# Patient Record
Sex: Male | Born: 1935 | Race: White | Hispanic: No | Marital: Married
Health system: Southern US, Community
[De-identification: ages and names within clinical notes are randomized; demographics above are authoritative.]

## PROBLEM LIST (undated history)

## (undated) DIAGNOSIS — F32A Depression, unspecified: Secondary | ICD-10-CM

## (undated) DIAGNOSIS — W19XXXA Unspecified fall, initial encounter: Secondary | ICD-10-CM

## (undated) DIAGNOSIS — E876 Hypokalemia: Secondary | ICD-10-CM

## (undated) DIAGNOSIS — I10 Essential (primary) hypertension: Secondary | ICD-10-CM

## (undated) DIAGNOSIS — H04129 Dry eye syndrome of unspecified lacrimal gland: Secondary | ICD-10-CM

## (undated) DIAGNOSIS — F329 Major depressive disorder, single episode, unspecified: Secondary | ICD-10-CM

## (undated) DIAGNOSIS — F0390 Unspecified dementia without behavioral disturbance: Secondary | ICD-10-CM

## (undated) DIAGNOSIS — M109 Gout, unspecified: Secondary | ICD-10-CM

---

## 2000-08-23 ENCOUNTER — Emergency Department (HOSPITAL_COMMUNITY): Admission: EM | Admit: 2000-08-23 | Discharge: 2000-08-23 | Payer: Self-pay | Admitting: Emergency Medicine

## 2006-11-19 ENCOUNTER — Encounter: Admission: RE | Admit: 2006-11-19 | Discharge: 2006-11-19 | Payer: Self-pay | Admitting: Internal Medicine

## 2007-06-16 ENCOUNTER — Ambulatory Visit: Admission: RE | Admit: 2007-06-16 | Discharge: 2007-07-01 | Payer: Self-pay | Admitting: Radiation Oncology

## 2007-07-02 ENCOUNTER — Ambulatory Visit: Admission: RE | Admit: 2007-07-02 | Discharge: 2007-09-30 | Payer: Self-pay | Admitting: Radiation Oncology

## 2007-10-01 ENCOUNTER — Ambulatory Visit: Admission: RE | Admit: 2007-10-01 | Discharge: 2007-10-15 | Payer: Self-pay | Admitting: Radiation Oncology

## 2008-12-08 ENCOUNTER — Inpatient Hospital Stay (HOSPITAL_COMMUNITY): Admission: EM | Admit: 2008-12-08 | Discharge: 2008-12-13 | Payer: Self-pay | Admitting: Emergency Medicine

## 2010-07-22 ENCOUNTER — Encounter: Payer: Self-pay | Admitting: Internal Medicine

## 2010-10-08 LAB — BASIC METABOLIC PANEL
BUN: 25 mg/dL — ABNORMAL HIGH (ref 6–23)
BUN: 30 mg/dL — ABNORMAL HIGH (ref 6–23)
CO2: 24 mEq/L (ref 19–32)
Calcium: 10.3 mg/dL (ref 8.4–10.5)
Calcium: 8.8 mg/dL (ref 8.4–10.5)
Calcium: 9.1 mg/dL (ref 8.4–10.5)
Chloride: 106 mEq/L (ref 96–112)
Creatinine, Ser: 0.98 mg/dL (ref 0.4–1.5)
Creatinine, Ser: 1.16 mg/dL (ref 0.4–1.5)
GFR calc Af Amer: >60 mL/min (ref >60)
GFR calc Af Amer: >60 mL/min (ref >60)
GFR calc non Af Amer: 59 mL/min — ABNORMAL LOW (ref >60)
GFR calc non Af Amer: >60 mL/min (ref >60)
GFR calc non Af Amer: >60 mL/min (ref >60)
Glucose, Bld: 98 mg/dL (ref 70–99)
Potassium: 3.7 mEq/L (ref 3.5–5.1)
Sodium: 137 mEq/L (ref 135–145)

## 2010-10-08 LAB — CBC
HCT: 34.3 % — ABNORMAL LOW (ref 39.0–52.0)
Hemoglobin: 11.6 g/dL — ABNORMAL LOW (ref 13.0–17.0)
MCHC: 34 g/dL (ref 30.0–36.0)
MCV: 91.8 fL (ref 78.0–100.0)
Platelets: 195 10*3/uL (ref 150–400)
Platelets: 214 10*3/uL (ref 150–400)
RBC: 3.76 MIL/uL — ABNORMAL LOW (ref 4.22–5.81)
RBC: 3.85 MIL/uL — ABNORMAL LOW (ref 4.22–5.81)
RDW: 12.8 % (ref 11.5–15.5)
WBC: 14.2 10*3/uL — ABNORMAL HIGH (ref 4.0–10.5)
WBC: 6.7 10*3/uL (ref 4.0–10.5)

## 2010-10-08 LAB — DIFFERENTIAL
Basophils Absolute: 0.1 10*3/uL (ref 0.0–0.1)
Eosinophils Absolute: 0.1 10*3/uL (ref 0.0–0.7)
Lymphocytes Relative: 11 % — ABNORMAL LOW (ref 12–46)
Lymphs Abs: 1.5 10*3/uL (ref 0.7–4.0)
Lymphs Abs: 1.6 10*3/uL (ref 0.7–4.0)
Monocytes Relative: 12 % (ref 3–12)
Neutro Abs: 4.2 10*3/uL (ref 1.7–7.7)
Neutrophils Relative %: 63 % (ref 43–77)
Neutrophils Relative %: 78 % — ABNORMAL HIGH (ref 43–77)

## 2010-10-08 LAB — HEPATIC FUNCTION PANEL
AST: 57 U/L — ABNORMAL HIGH (ref 0–37)
Bilirubin, Direct: 0.4 mg/dL — ABNORMAL HIGH (ref 0.0–0.3)
Indirect Bilirubin: 0.8 mg/dL (ref 0.3–0.9)
Total Bilirubin: 1.2 mg/dL (ref 0.3–1.2)

## 2010-10-08 LAB — CULTURE, BLOOD (ROUTINE X 2): Culture: NO GROWTH

## 2010-10-08 LAB — URINALYSIS, ROUTINE W REFLEX MICROSCOPIC
Glucose, UA: NEGATIVE mg/dL
Ketones, ur: 15 mg/dL — AB
Nitrite: NEGATIVE
Protein, ur: NEGATIVE mg/dL
pH: 5 (ref 5.0–8.0)

## 2010-10-08 LAB — CK TOTAL AND CKMB (NOT AT ARMC)
CK, MB: 7.8 ng/mL — ABNORMAL HIGH (ref 0.3–4.0)
Relative Index: 1 (ref 0.0–2.5)
Total CK: 772 U/L — ABNORMAL HIGH (ref 7–232)

## 2010-10-08 LAB — URINE CULTURE

## 2010-10-08 LAB — CARDIAC PANEL(CRET KIN+CKTOT+MB+TROPI)
CK, MB: 8.8 ng/mL — ABNORMAL HIGH (ref 0.3–4.0)
Relative Index: 1.4 (ref 0.0–2.5)
Total CK: 623 U/L — ABNORMAL HIGH (ref 7–232)
Troponin I: 0.01 ng/mL (ref 0.00–0.06)

## 2010-10-08 LAB — MAGNESIUM: Magnesium: 2.1 mg/dL (ref 1.5–2.5)

## 2010-10-08 LAB — PROTIME-INR
INR: 1.1 (ref 0.00–1.49)
Prothrombin Time: 15 seconds (ref 11.6–15.2)

## 2010-11-13 NOTE — H&P (Signed)
NAME:  [name], [name] NO.:  [id number]   MEDICAL RECORD NO.:  [id number]          PATIENT TYPE:  INP   LOCATION:  1859                         FACILITY:  [facility]   PHYSICIAN:  Ramiro Harvest, MD    DATE OF BIRTH:  [birth year]   DATE OF ADMISSION:  12/07/2008  DATE OF DISCHARGE:                              HISTORY & PHYSICAL   PRIMARY CARE PHYSICIAN:  Dr. Kirby Funk [facility].   HISTORY OF PRESENT ILLNESS:  Mr. Zachary Sellers is an 77 year old white  gentleman who lives alone with a history of dementia, hypertension, and  falls who presented to the ED with left foot pain.  The patient is a  poor historian and as such, history was obtained from the ED notes, as  well as from his daughter.  Per family the patient had been doing fine 4  days prior to admission.  The family usually calls the patient on an  every other day basis.  The daughter called 1 day prior to admission and  had no response.  One daughter then presented to the patient's home  where she found the front door open and found the morning newspapers  still laying in the driveway.  She found the patient lying on the floor  looking unkempt with left foot pain, erythema, warmth, and edema.  Per  family, the patient has also had decreased p.o. intake.  The patient  then presented to the ED where per ED records, the patient has had a II  to 3-day history of increasing left foot pain, erythema, and edema.  No  recent trauma.  The patient and family deny any fevers.  No chest pain,  no shortness of breath, no syncope, no nausea, no vomiting, no diarrhea,  no abdominal pain.  No focal neurological symptoms.  No cough, no  dysuria, no other associated symptoms.  The patient does endorse some  mild constipation intermittently.  The patient not too sure why he is in  the ED.  In the ED, x-rays of the left foot were negative for fracture.  CBC obtained had a white count of 14.2 and an ANC of 11.1, otherwise  was  within normal limits.  BMET with a potassium of 3.3, otherwise was  within normal limits.  We were called to admit the patient.  Per family,  the patient lives alone and also desires placement possibly in an  assisted living facility.   ALLERGIES:  NO KNOWN DRUG ALLERGIES.   PAST MEDICAL HISTORY:  1. Senile dementia.  2. Hypertension.  3. History of falls.   HOME MEDICATIONS:  1. Aricept 10 mg p.o. q.h.s.  2. HCTZ 12.5 mg p.o. daily   SOCIAL HISTORY:  The patient used to smoke tobacco approximately 30  years ago and has quit since then.  No tobacco use currently.  No  alcohol use.  No IV drug use.  The patient lives alone and uses a cane  occasionally.  The patient is widowed.   FAMILY HISTORY:  Noncontributory.   REVIEW OF SYSTEMS:  As per HPI, otherwise negative.  PHYSICAL EXAMINATION:  Temperature 99.4, blood pressure 131/82, pulse  104, respiratory rate 20, satting 97% on room air.  GENERAL:  Patient lying on gurney in no apparent distress.  HEENT: Normocephalic, atraumatic.  Pupils equal, round, and reactive to  light and accommodation.  Extraocular movements intact.  Oropharynx is  clear.  No lesions, no exudates.  Neck is supple.  No lymphadenopathy.  Dry mucous membranes.  RESPIRATORY:  Lungs are clear to auscultation bilaterally.  No wheezes,  no crackles, no rhonchi.  CARDIOVASCULAR:  Regular rate and rhythm.  No murmurs, rubs or gallops.  ABDOMEN:  Soft, nontender and nondistended.  Positive bowel sounds.  EXTREMITIES:  No clubbing.  No cyanosis.  Left foot with erythema on the  medial ankle and the forefoot with some warmth and edema as well.  NEUROLOGICALLY:  The patient is alert and oriented.  Cranial nerves II-  XII are grossly intact.  No focal deficits.   ADMISSION LABORATORY DATA:  Sodium 139, potassium 3.3, chloride 100,  bicarb 26, BUN 30, creatinine 1.16, glucose of 98, calcium of 10.3.  CBC  with a white count of 14.2, hemoglobin 14.5,  hematocrit 41.9, platelets  of 214, ANC of 11.1.  Plain films of the left ankle show no acute bony  findings.  Plain films of the left foot show no acute bony findings.   ASSESSMENT AND PLAN:  Mr. Zachary Sellers is an 77 year old gentleman who  presents to the emergency department with left foot pain, being found on  the floor per family.  1. Left foot cellulitis.  We will admit the patient to the floor.      Check blood cultures x2.  Check a UA with cultures and      sensitivities.  Check hepatic function.  Will elevate left foot      with warm compresses.  Will place on empiric IV clindamycin for      now.  PT, OT and follow.  2. Dehydration.  Hydrate with IV fluids.  Hold blood pressure      medications.  3. Questionable fall, likely mechanical in nature.  Will cycle cardiac      enzymes q.12 h. x2.  Check an EKG.  Hydrate with IV fluids.  PT,      OT.  4. Hypokalemia.  Check a magnesium level and replete.  5. Dementia.  Aricept.  6. Hypotension.  Hold blood pressure meds.  7. Prophylaxis.  Pepcid for GI prophylaxis.  Lovenox for DVT      prophylaxis.   It has been a pleasure taking care of Mr. Zachary Sellers.      Ramiro Harvest, MD  Electronically Signed     DT/MEDQ  D:  12/08/2008  T:  12/08/2008  Job:  [id number]   cc:   Thora Lance, M.D.

## 2010-11-13 NOTE — Discharge Summary (Signed)
NAME:  Zachary Sellers, Zachary Sellers NO.:  [id number]   MEDICAL RECORD NO.:  [id number]          PATIENT TYPE:  INP   LOCATION:  5004                         FACILITY:  [facility]   PHYSICIAN:  Thora Lance, M.D.  DATE OF BIRTH:  04/21/1917   DATE OF ADMISSION:  12/07/2008  DATE OF DISCHARGE:  12/13/2008                               DISCHARGE SUMMARY   REASON FOR ADMISSION:  [name] is a 77 year old white male who  presented to the emergency room with left foot pain.  One day prior to  admission there was no response and the daughter went to the patient's  home, where the patient was found lying on the floor unkempt and unable  to get up.  His left foot showed pain, erythema and warmth.  The patient  was sent to the emergency room.   SIGNIFICANT FINDINGS:  Blood pressure 131/82, heart rate 104,  temperature 99.4, respirations 20, saturations 97% on room air.  LUNGS:  Clear.  HEART:  Regular rate and rhythm.  ABDOMEN:  Benign.  EXTREMITIES:  Shows left foot with erythema and tenderness in the medial  proximal foot.   LABORATORIES:  Potassium 3.3, sodium 39, bicarbonate 26, chloride 100,  BUN 30, creatinine 1.16, glucose 98, calcium 10.3.  CBC:  WBC 14.2,  hemoglobin 14.5, platelet count 214.  Left ankle x-ray showed no acute  findings; left foot no acute findings.   HOSPITAL COURSE:  The patient was admitted for a left foot cellulitis.  He was started on clindamycin.  When I saw the patient's foot I felt he  probably had gout.  Prednisone was added to his regimen.  The patient  was treated with IV fluids for dehydration, and his BUN and creatinine  improved significantly to a level of 19.8.  His potassium was repleted.  His white count normalized to 6.6.  His foot pain and erythema totally  resolved.  The patient does have significant dementia, and at the  request of family he was sent for nursing home placement.   DISCHARGE DIAGNOSES:  1. Left foot gout.  2. Possible  left foot cellulitis.  3. Dementia.  4. Hypertension.   PROCEDURES:  None.   DISCHARGE MEDICATIONS:  1. Clindamycin 300 mg p.o. b.i.d. for two days and then discontinue.  2. Prednisone 10 mg daily for two days and then discontinue.  3. Aricept 10 mg q.h.s.  4. Hydrochlorothiazide 12.5 mg daily.  5. Potassium chloride 10 mEq daily.   DISPOSITION:  Discharge to nursing home.   CODE STATUS:  Full code.   DIET:  Low-sodium diet.   ACTIVITY:  As tolerated.           ______________________________  Thora Lance, M.D.     JJG/MEDQ  D:  12/13/2008  T:  12/13/2008  Job:  [id number]

## 2011-02-13 IMAGING — CR DG ANKLE COMPLETE 3+V*L*
3 series · 3 of 3 positions shown · non-contrast
Comparison: None

CLINICAL DATA: Ankle pain and swelling.

LEFT ANKLE COMPLETE - 3+ VIEW

[view not recorded (1 of 3)]
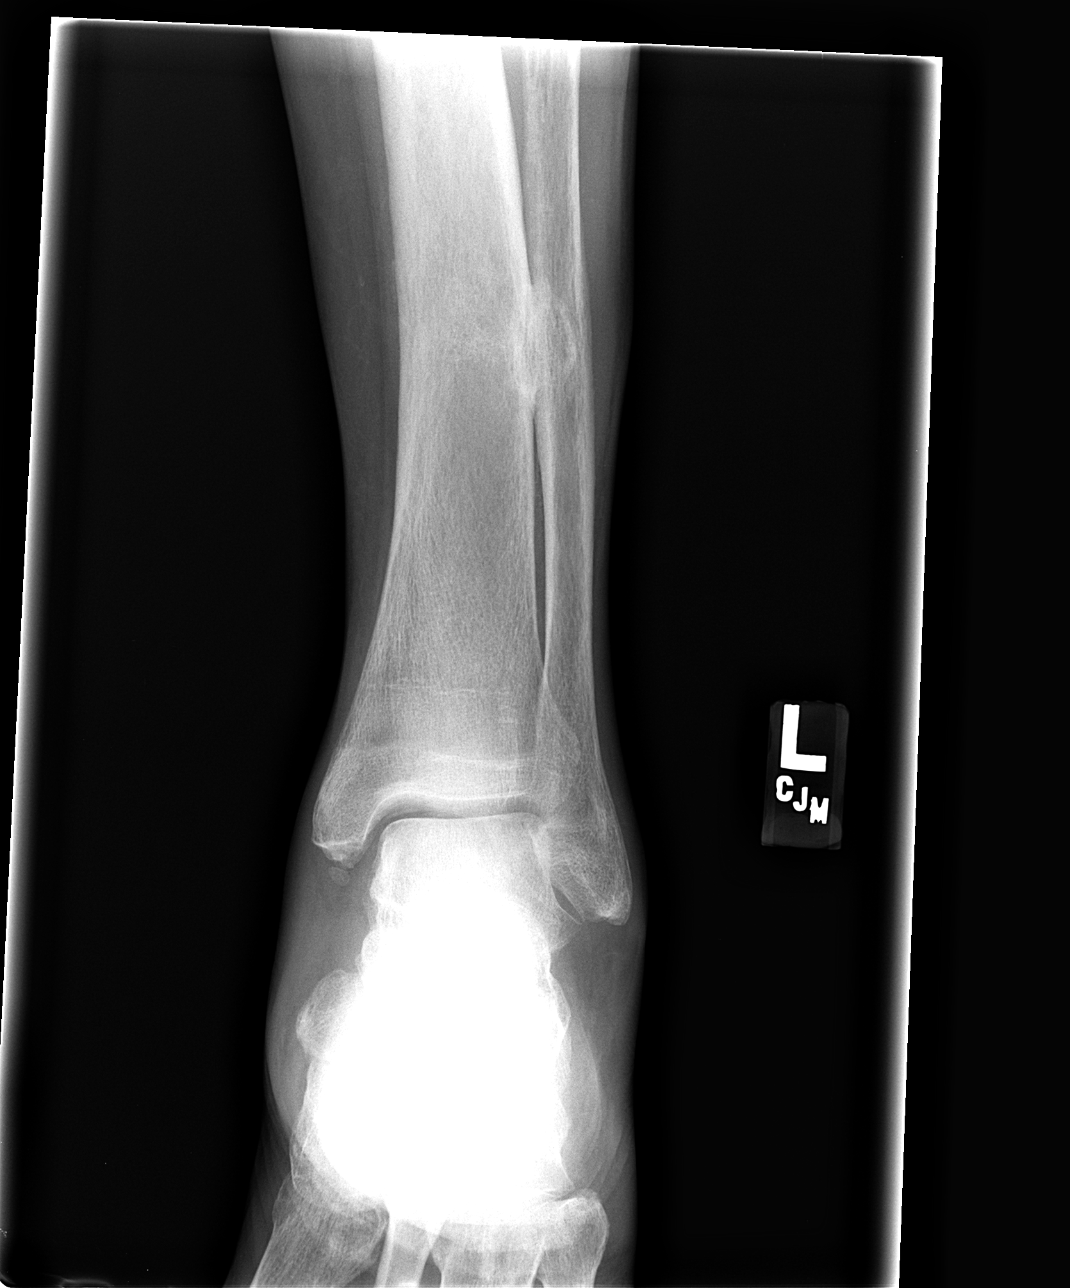

[view not recorded (2 of 3)]
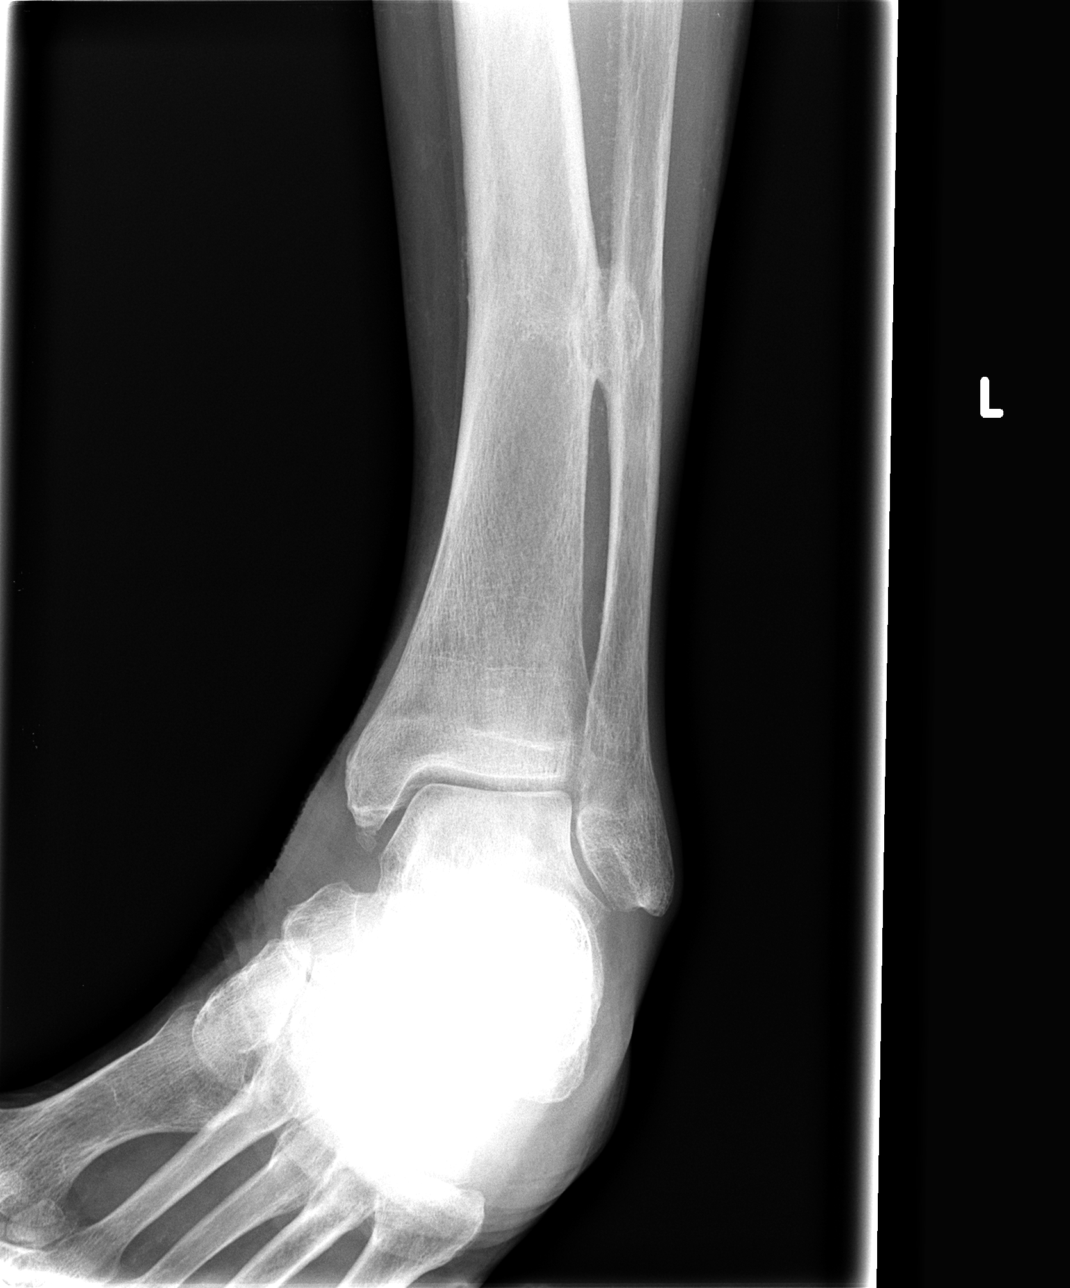

[view not recorded (3 of 3)]
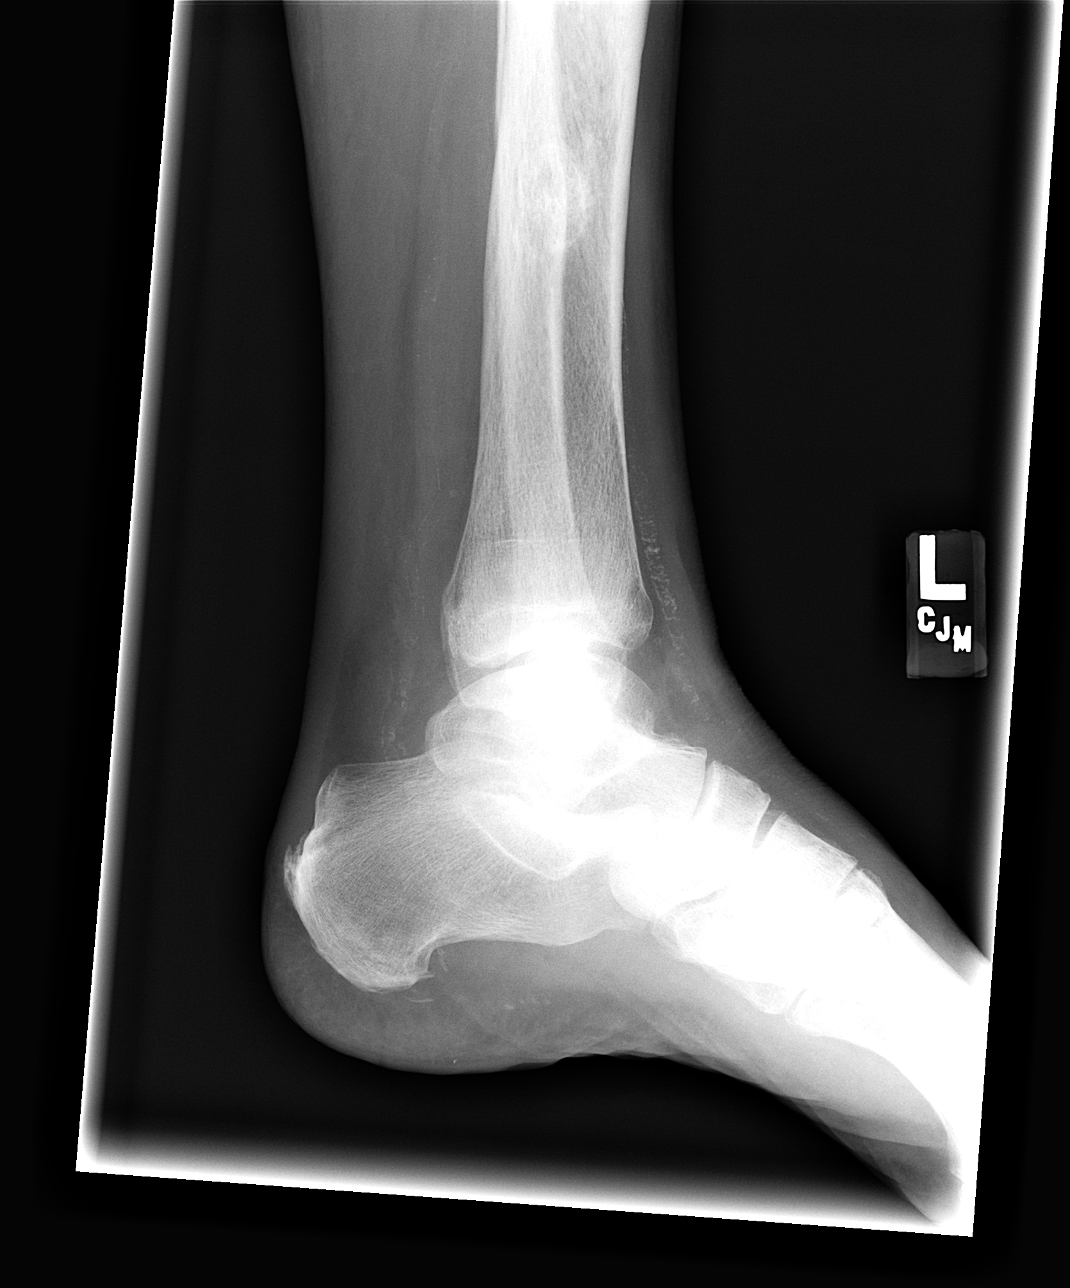

[3 of 3 positions shown; findings below may reference images not displayed]

FINDINGS: The ankle mortise is maintained.  No acute fractures are
seen.  No osteochondral abnormality.  Rounded density off the
distal tip of the medial malleolus is likely a remote avulsion
fracture or unfused secondary ossification center.  There is a
benign appearing osteochondroma projecting off the lateral aspect
of the tibia.

Extensive vascular calcifications.
IMPRESSION: No acute bony findings.

## 2014-09-18 ENCOUNTER — Emergency Department (HOSPITAL_COMMUNITY)
Admission: EM | Admit: 2014-09-18 | Discharge: 2014-09-18 | Disposition: A | Discharge status: Home / Self Care | Payer: Medicare Other | Attending: Emergency Medicine | Admitting: Emergency Medicine

## 2014-09-18 ENCOUNTER — Encounter (HOSPITAL_COMMUNITY): Payer: Self-pay | Admitting: Emergency Medicine

## 2014-09-18 ENCOUNTER — Emergency Department (HOSPITAL_COMMUNITY): Payer: Medicare Other

## 2014-09-18 DIAGNOSIS — Z8739 Personal history of other diseases of the musculoskeletal system and connective tissue: Secondary | ICD-10-CM | POA: Diagnosis not present

## 2014-09-18 DIAGNOSIS — R531 Weakness: Secondary | ICD-10-CM | POA: Diagnosis not present

## 2014-09-18 DIAGNOSIS — F0390 Unspecified dementia without behavioral disturbance: Secondary | ICD-10-CM | POA: Diagnosis not present

## 2014-09-18 DIAGNOSIS — I10 Essential (primary) hypertension: Secondary | ICD-10-CM | POA: Diagnosis not present

## 2014-09-18 DIAGNOSIS — Z8639 Personal history of other endocrine, nutritional and metabolic disease: Secondary | ICD-10-CM | POA: Insufficient documentation

## 2014-09-18 DIAGNOSIS — Z8669 Personal history of other diseases of the nervous system and sense organs: Secondary | ICD-10-CM | POA: Diagnosis not present

## 2014-09-18 HISTORY — DX: Essential (primary) hypertension: I10

## 2014-09-18 HISTORY — DX: Unspecified dementia, unspecified severity, without behavioral disturbance, psychotic disturbance, mood disturbance, and anxiety: F03.90

## 2014-09-18 HISTORY — DX: Hypokalemia: E87.6

## 2014-09-18 HISTORY — DX: Gout, unspecified: M10.9

## 2014-09-18 HISTORY — DX: Major depressive disorder, single episode, unspecified: F32.9

## 2014-09-18 HISTORY — DX: Unspecified fall, initial encounter: W19.XXXA

## 2014-09-18 HISTORY — DX: Dry eye syndrome of unspecified lacrimal gland: H04.129

## 2014-09-18 HISTORY — DX: Depression, unspecified: F32.A

## 2014-09-18 LAB — URINALYSIS, ROUTINE W REFLEX MICROSCOPIC
BILIRUBIN URINE: NEGATIVE
Glucose, UA: NEGATIVE mg/dL
HGB URINE DIPSTICK: NEGATIVE
Ketones, ur: NEGATIVE mg/dL
Leukocytes, UA: NEGATIVE
Nitrite: NEGATIVE
Protein, ur: NEGATIVE mg/dL
Specific Gravity, Urine: 1.023 (ref 1.005–1.030)
Urobilinogen, UA: 0.2 mg/dL (ref 0.0–1.0)
pH: 5.5 (ref 5.0–8.0)

## 2014-09-18 LAB — I-STAT TROPONIN, ED: Troponin i, poc: 0.01 ng/mL (ref 0.00–0.08)

## 2014-09-18 MED ORDER — SODIUM CHLORIDE 0.9 % IV BOLUS (SEPSIS)
500.0000 mL | Freq: Once | INTRAVENOUS | Status: AC
  Administered 2014-09-18: 500 mL via INTRAVENOUS

## 2014-09-18 NOTE — ED Notes (Signed)
Patient transported to CT 

## 2014-09-18 NOTE — ED Provider Notes (Signed)
  Face-to-face evaluation   History: Weakness noted, right face, by staff at his facility. He cannot contribute to history.  Physical exam: Alert, elderly man who is comfortable and calm. He is cooperative. He follows commands. No facial asymmetry. Normal grip strength, hands bilaterally.  Medical screening examination/treatment/procedure(s) were conducted as a shared visit with non-physician practitioner(s) and myself.  I personally evaluated the patient during the encounter   Mancel BaleElliott Eulan Heyward, MD 09/19/14 1021

## 2014-09-18 NOTE — ED Provider Notes (Signed)
CSN: [id number]     Arrival date & time 09/18/14  1234 History   First MD Initiated Contact with Patient 09/18/14 1251     Chief Complaint  Patient presents with  . Transient Ischemic Attack   Level V caveat: Dementia  (Consider location/radiation/quality/duration/timing/severity/associated sxs/prior Treatment) HPI Zachary Sellers is a 81 y.o. male with a history of dementia, hypertension comes in for evaluation of strokelike symptoms. History of present illness is given by [facility] as well as charge nurse, Crystal from [facility] where patient resides. Crystal reports patient is typically very playful, is independently ambulatory and usually sits up straight, but since this morning at approximately 11:00 the cook at the cafeteria noticed patient had a right sided facial droop and was leaning to his right side. He reports these symptoms all resolved at 11:10. Crystal also reports Friday night patient had unwitnessed fall and since then appears to be in more pain. Reports his baseline is slowed and slurred speech and is usually only oriented to person and sometimes struggles with this. No other aggravating or modifying factors. Patient appears playful in the ED, is smiling. Denies any discomfort or pain.  Past Medical History  Diagnosis Date  . Dementia   . Gout   . Hypertension   . Fall   . Depression   . Dry eye syndrome   . Hypokalemia    History reviewed. No pertinent past surgical history. No family history on file. History  Substance Use Topics  . Smoking status: Unknown If Ever Smoked  . Smokeless tobacco: Not on file  . Alcohol Use: No    Review of Systems  Unable to perform ROS       Allergies  Review of patient's allergies indicates not on file.  Home Medications   Prior to Admission medications   Not on File   BP 115/60 mmHg  Pulse 71  Temp(Src) 97.7 F (36.5 C) (Axillary)  Resp 21  SpO2 95% Physical Exam  Constitutional: He  appears well-developed and well-nourished.  Patient with dementia. GCS 15. Patient appears playful here in the ED.  HENT:  Head: Normocephalic and atraumatic.  Dry mucous membranes. Uvula midline. Edentulous  Eyes: Conjunctivae and EOM are normal. Pupils are equal, round, and reactive to light. Right eye exhibits no discharge. Left eye exhibits no discharge. No scleral icterus.  Neck: Normal range of motion. Neck supple.  Cardiovascular: Normal rate, regular rhythm and normal heart sounds.   Pulmonary/Chest: Effort normal and breath sounds normal. No respiratory distress. He has no wheezes. He has no rales.  Abdominal: Soft. There is no tenderness.  Musculoskeletal: He exhibits no tenderness.  No complaints of pain or grimacing with range of motion of extremities.  Neurological: He is alert.  Oriented to person. Cranial Nerves II-XII grossly intact. There appears to be mild right-sided facial droop with flattening of nasolabial fold. However patient is able to smile and blowout both cheeks symmetrically. Moves all extremities without ataxia. Grip strength intact and equal bilaterally.  Skin: Skin is warm and dry. No rash noted.  Psychiatric: He has a normal mood and affect.  Nursing note and vitals reviewed.   ED Course  Procedures (including critical care time) Labs Review Labs Reviewed  URINALYSIS, ROUTINE W REFLEX MICROSCOPIC  I-STAT TROPOININ, ED    Imaging Review Dg Chest 2 View  09/18/2014   CLINICAL DATA:  Per ED note: Pt arrived by [facility] from [facility] with c/o TIA. Pt has dementia  and alert to person only which is his norm. Staff placed pt at the table to eat and went to get his food when staff came back to pt, pt had right side facial droop, unable to move right side of body. LSN 1100 and Stroke symptoms discovered at 1110. EMS arrived and pt back to baseline. Equal grip strengths per EMS  EXAM: CHEST  2 VIEW  COMPARISON:  None.  FINDINGS: Cardiac silhouette  mildly enlarged. No mediastinal or hilar masses or evidence of adenopathy.  There is some posterior lung base opacity on the lateral view, likely atelectasis. Remainder of the lungs is clear. No pleural effusion or pneumothorax.  Bony thorax is demineralized but grossly intact.  IMPRESSION: No acute cardiopulmonary disease.   Electronically Signed   By: Amie Portland M.D.   On: 09/18/2014 15:06   Ct Head Wo Contrast  09/18/2014   CLINICAL DATA:  Pt arrived by [facility] from [facility] with c/o TIA. Pt has dementia and alert to person only which is his norm. Staff placed pt at the table to eat and went to get his food when staff came back to pt, pt had right side facial droop, unable to move right side of body. LSN 1100 and Stroke symptoms discovered at 1110. EMS arrived and pt back to baseline. Equal grip strengths per EMS.  EXAM: CT HEAD WITHOUT CONTRAST  TECHNIQUE: Contiguous axial images were obtained from the base of the skull through the vertex without intravenous contrast.  COMPARISON:  11/19/2006  FINDINGS: Ventricles normal configuration. There is ventricular sulcal enlargement reflecting moderate atrophy.  No parenchymal masses or mass effect. No evidence of a cortical infarct. Small lacune infarct noted along the superior left cerebellum. Mild patchy white matter hypoattenuation noted consistent with chronic microvascular ischemic change. These findings are stable.  No extra-axial masses or abnormal fluid collections.  There is no intracranial hemorrhage.  Small right frontal sinus is opacified with mucosal thickening. There is minor ethmoid sinus mucosal thickening. Remaining sinuses are clear. Clear mastoid air cells.  IMPRESSION: 1. No acute intracranial abnormalities. No CT evidence of an acute infarct. No intracranial hemorrhage. 2. Moderate atrophy and mild chronic microvascular ischemic change.   Electronically Signed   By: Amie Portland M.D.   On: 09/18/2014 13:29     EKG  Interpretation   Date/Time:  Sunday September 18 2014 12:55:56 EDT Ventricular Rate:  82 PR Interval:  184 QRS Duration: 88 QT Interval:  377 QTC Calculation: 440 R Axis:   8 Text Interpretation:  Sinus rhythm Low voltage, precordial leads since  last tracing no significant change Confirmed by Effie Shy  MD, ELLIOTT [phone])  on 09/18/2014 3:40:31 PM     Meds given in ED:  Medications  sodium chloride 0.9 % bolus 500 mL (0 mLs Intravenous Stopped 09/18/14 1650)    New Prescriptions   No medications on file   Filed Vitals:   09/18/14 1245 09/18/14 1305 09/18/14 1452  BP: 96/47 87/44 115/60  Pulse: 79 77 71  Temp: 97.7 F (36.5 C)    TempSrc: Axillary    Resp: SpO2: 95% 97% 95%     MDM  Velta Addison, patient's daughter is at bedside who reports patient has fallen 3 times in the last week and complains of his right shoulder hurting and that is why he leans to the right. She denies any facial droop and reports this is his baseline and he does not look any different than  usual.  Vitals stable - WNL -afebrile Pt resting comfortably in ED. At baseline per family. PE--moves all 4 extremities without ataxia. Grip strength is intact and equal bilaterally. Labwork noncontributory. No evidence of UTI. Imaging--CT shows no acute intracranial abnormalities. CXR shows no acute cardiopulmonary pathology  DDX--patient with nonspecific weakness. Considered TIA and CVA, but no focal findings on neuro exam as well as normal head CT. Pt is edentulous and this may be responsible for mildly flattened nasolabial folds, which is baseline for pt. Discussed findings with family and they are okay without pursuing further treatment. Patient is DNR. Prefer to have pt discharged back to his living facility.  I discussed all relevant lab findings and imaging results with pt and they verbalized understanding. Discussed f/u with PCP within 48 hrs and return precautions, pt very amenable to  plan.  Prior to patient discharge, I discussed and reviewed this case with Dr.Wentz, who also saw and evaluated the pt.   Final diagnoses:  Generalized weakness        Joycie PeekBenjamin Chad Donoghue, PA-C 09/18/14 1722  Mancel BaleElliott Wentz, MD 09/19/14 1021

## 2014-09-18 NOTE — ED Notes (Signed)
Labs need to be recollected.  

## 2014-09-18 NOTE — ED Notes (Signed)
[facility] with c/o TIA. Pt has dementia and alert to person only which is his norm. Staff placed pt at the table to eat and went to get his food when staff came back to pt, pt had right side facial droop, unable to move right side of body. LSN 1100 and Stroke symptoms discovered at 1110. EMS arrived and pt back to baseline. Equal grip strengths per EMS.

## 2014-09-18 NOTE — Discharge Instructions (Signed)
Fatigue °Fatigue is a feeling of tiredness, lack of energy, lack of motivation, or feeling tired all the time. Having enough rest, good nutrition, and reducing stress will normally reduce fatigue. Consult your caregiver if it persists. The nature of your fatigue will help your caregiver to find out its cause. The treatment is based on the cause.  °CAUSES  °There are many causes for fatigue. Most of the time, fatigue can be traced to one or more of your habits or routines. Most causes fit into one or more of three general areas. They are: °Lifestyle problems °· Sleep disturbances. °· Overwork. °· Physical exertion. °· Unhealthy habits. °· Poor eating habits or eating disorders. °· Alcohol and/or drug use . °· Lack of proper nutrition (malnutrition). °Psychological problems °· Stress and/or anxiety problems. °· Depression. °· Grief. °· Boredom. °Medical Problems or Conditions °· Anemia. °· Pregnancy. °· Thyroid gland problems. °· Recovery from major surgery. °· Continuous pain. °· Emphysema or asthma that is not well controlled °· Allergic conditions. °· Diabetes. °· Infections (such as mononucleosis). °· Obesity. °· Sleep disorders, such as sleep apnea. °· Heart failure or other heart-related problems. °· Cancer. °· Kidney disease. °· Liver disease. °· Effects of certain medicines such as antihistamines, cough and cold remedies, prescription pain medicines, heart and blood pressure medicines, drugs used for treatment of cancer, and some antidepressants. °SYMPTOMS  °The symptoms of fatigue include:  °· Lack of energy. °· Lack of drive (motivation). °· Drowsiness. °· Feeling of indifference to the surroundings. °DIAGNOSIS  °The details of how you feel help guide your caregiver in finding out what is causing the fatigue. You will be asked about your present and past health condition. It is important to review all medicines that you take, including prescription and non-prescription items. A thorough exam will be done.  You will be questioned about your feelings, habits, and normal lifestyle. Your caregiver may suggest blood tests, urine tests, or other tests to look for common medical causes of fatigue.  °TREATMENT  °Fatigue is treated by correcting the underlying cause. For example, if you have continuous pain or depression, treating these causes will improve how you feel. Similarly, adjusting the dose of certain medicines will help in reducing fatigue.  °HOME CARE INSTRUCTIONS  °· Try to get the required amount of good sleep every night. °· Eat a healthy and nutritious diet, and drink enough water throughout the day. °· Practice ways of relaxing (including yoga or meditation). °· Exercise regularly. °· Make plans to change situations that cause stress. Act on those plans so that stresses decrease over time. Keep your work and personal routine reasonable. °· Avoid street drugs and minimize use of alcohol. °· Start taking a daily multivitamin after consulting your caregiver. °SEEK MEDICAL CARE IF:  °· You have persistent tiredness, which cannot be accounted for. °· You have fever. °· You have unintentional weight loss. °· You have headaches. °· You have disturbed sleep throughout the night. °· You are feeling sad. °· You have constipation. °· You have dry skin. °· You have gained weight. °· You are taking any new or different medicines that you suspect are causing fatigue. °· You are unable to sleep at night. °· You develop any unusual swelling of your legs or other parts of your body. °SEEK IMMEDIATE MEDICAL CARE IF:  °· You are feeling confused. °· Your vision is blurred. °· You feel faint or pass out. °· You develop severe headache. °· You develop severe abdominal, pelvic, or   back pain.  You develop chest pain, shortness of breath, or an irregular or fast heartbeat.  You are unable to pass a normal amount of urine.  You develop abnormal bleeding such as bleeding from the rectum or you vomit blood.  You have thoughts  about harming yourself or committing suicide.  You are worried that you might harm someone else. MAKE SURE YOU:   Understand these instructions.  Will watch your condition.  Will get help right away if you are not doing well or get worse. Document Released: 04/14/2007 Document Revised: 09/09/2011 Document Reviewed: 10/19/2013 [facility] Patient Information 2015 [place], [place]. This information is not intended to replace advice given to you by your health care provider. Make sure you discuss any questions you have with your health care provider.  Weakness Weakness is a lack of strength. You may feel weak all over your body or just in one part of your body. Weakness can be serious. In some cases, you may need more medical tests. HOME CARE  Rest.  Eat a well-balanced diet.  Try to exercise every day.  Only take medicines as told by your doctor. GET HELP RIGHT AWAY IF:   You cannot do your normal daily activities.  You cannot walk up and down stairs, or you feel very tired when you do so.  You have shortness of breath or chest pain.  You have trouble moving parts of your body.  You have weakness in only one body part or on only one side of the body.  You have a fever.  You have trouble speaking or swallowing.  You cannot control when you pee (urinate) or poop (bowel movement).  You have black or bloody throw up (vomit) or poop.  Your weakness gets worse or spreads to other body parts.  You have new aches or pains. MAKE SURE YOU:   Understand these instructions.  Will watch your condition.  Will get help right away if you are not doing well or get worse. Document Released: 05/30/2008 Document Revised: 12/17/2011 Document Reviewed: 08/16/2011 [facility] Patient Information 2015 [place], [place]. This information is not intended to replace advice given to you by your health care provider. Make sure you discuss any questions you have with your health care provider.  Your  evaluation in the ED today showed no acute or emergent causes for your weakness. It is important for you to stay well hydrated and to follow up with your doctor as needed for any symptoms you may experience. Return to ED for new or worsening symptoms.

## 2014-09-18 NOTE — ED Notes (Signed)
CALLED [facility] FOR TRANSPORT °

## 2014-10-30 DEATH — deceased

## 2016-11-24 IMAGING — CR DG CHEST 2V
2 series · 2 of 2 positions shown · non-contrast
Comparison: None.

CLINICAL DATA: Per ED note: Pt arrived by [REDACTED] from [REDACTED] with c/o TIA. Pt has dementia and alert to person
only which is his norm. Staff placed pt at the table to eat and went
to get his food when staff came back to pt, pt had right side facial
droop, unable to move right side of body. NORDSTROM 6666 and Stroke
symptoms discovered at 3336. EMS arrived and pt back to baseline.
Equal grip strengths per EMS

EXAM:
CHEST  2 VIEW

[chest lat]
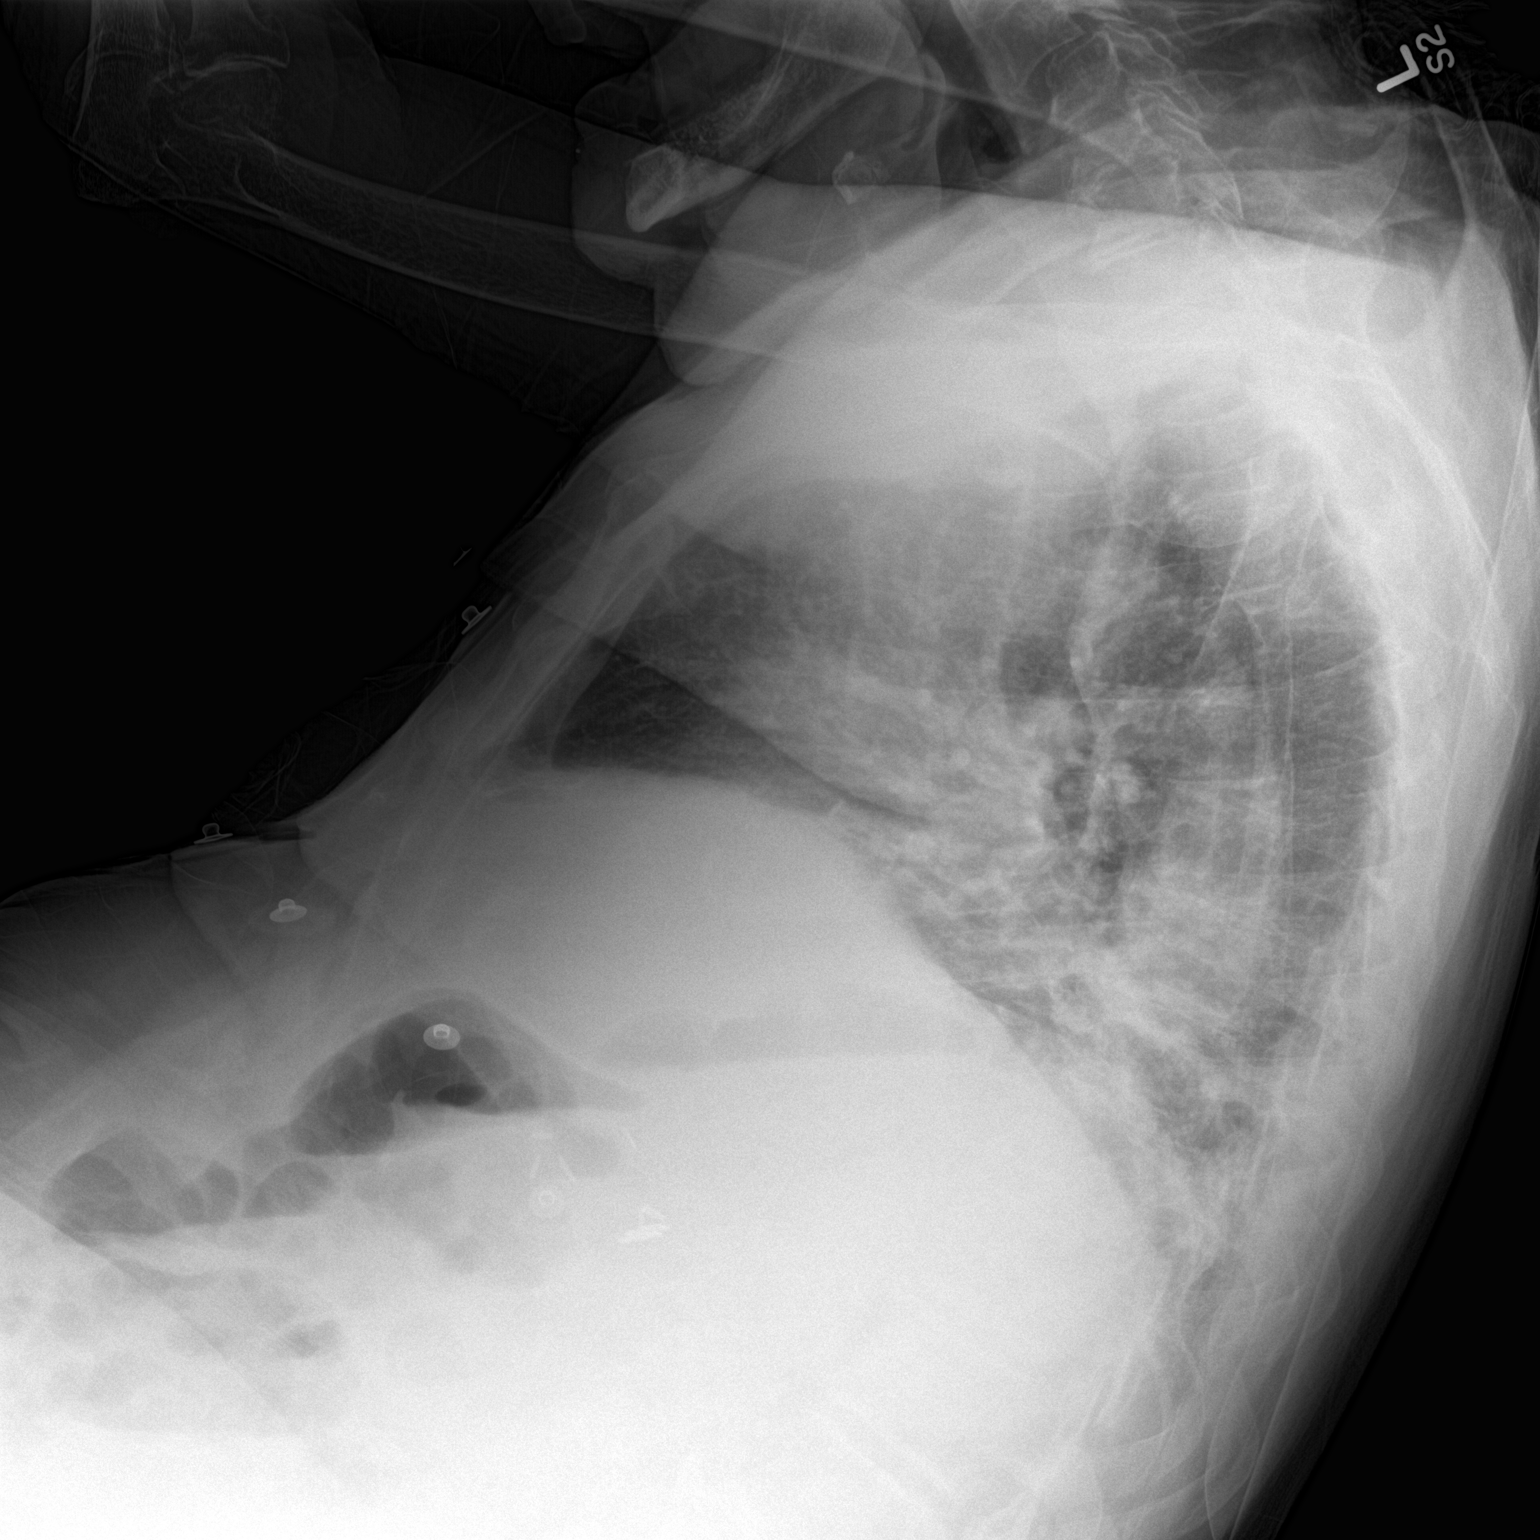

[chest ap]
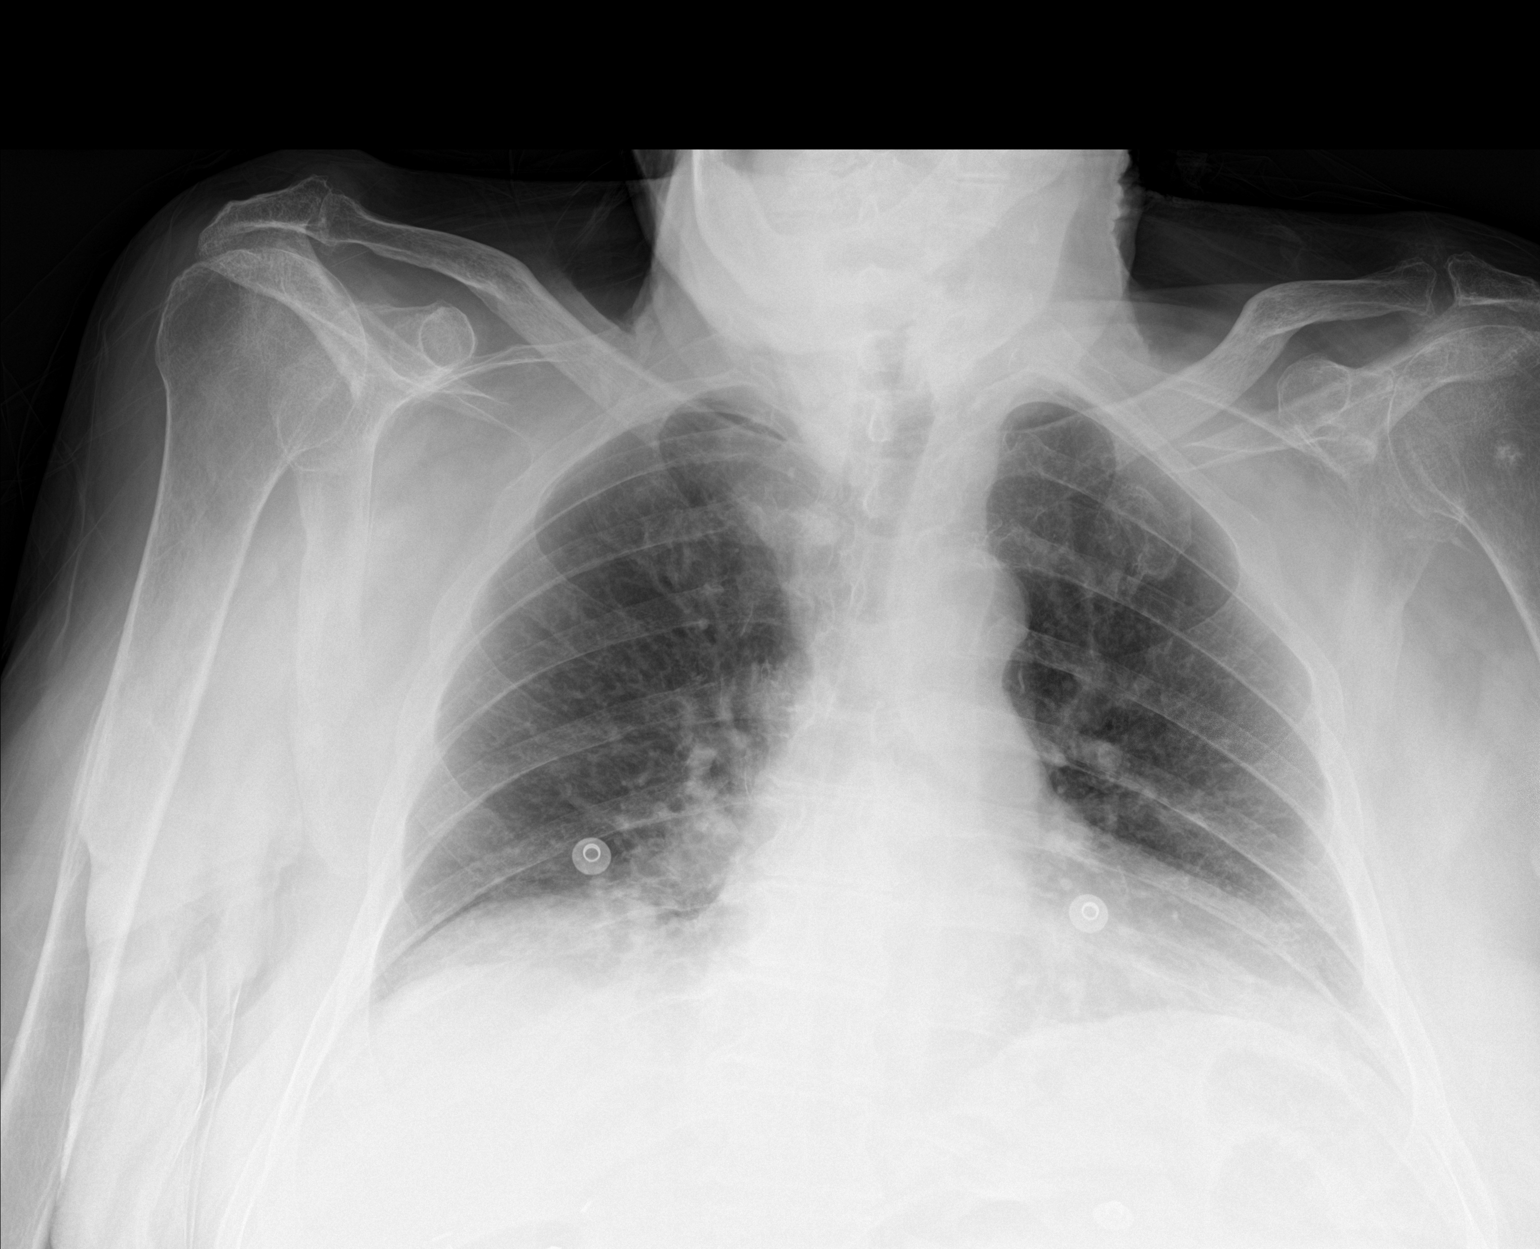

[2 of 2 positions shown; findings below may reference images not displayed]

FINDINGS: Cardiac silhouette mildly enlarged. No mediastinal or hilar masses
or evidence of adenopathy.

There is some posterior lung base opacity on the lateral view,
likely atelectasis. Remainder of the lungs is clear. No pleural
effusion or pneumothorax.

Bony thorax is demineralized but grossly intact.
IMPRESSION: No acute cardiopulmonary disease.
